# Patient Record
Sex: Female | Born: 1960 | Race: Black or African American | Hispanic: No | Marital: Married | Smoking: Never smoker
Health system: Southern US, Community
[De-identification: ages and names within clinical notes are randomized; demographics above are authoritative.]

## PROBLEM LIST (undated history)

## (undated) DIAGNOSIS — I10 Essential (primary) hypertension: Secondary | ICD-10-CM

## (undated) DIAGNOSIS — J45909 Unspecified asthma, uncomplicated: Secondary | ICD-10-CM

---

## 2010-02-10 ENCOUNTER — Ambulatory Visit: Payer: Self-pay | Admitting: Diagnostic Radiology

## 2010-02-10 ENCOUNTER — Emergency Department (HOSPITAL_BASED_OUTPATIENT_CLINIC_OR_DEPARTMENT_OTHER): Admission: EM | Admit: 2010-02-10 | Discharge: 2010-02-10 | Payer: Self-pay | Admitting: Emergency Medicine

## 2010-09-14 ENCOUNTER — Emergency Department (HOSPITAL_BASED_OUTPATIENT_CLINIC_OR_DEPARTMENT_OTHER)
Admission: EM | Admit: 2010-09-14 | Discharge: 2010-09-14 | Disposition: A | Discharge status: Home / Self Care | Payer: Self-pay | Attending: Emergency Medicine | Admitting: Emergency Medicine

## 2010-09-14 DIAGNOSIS — Y92009 Unspecified place in unspecified non-institutional (private) residence as the place of occurrence of the external cause: Secondary | ICD-10-CM | POA: Insufficient documentation

## 2010-09-14 DIAGNOSIS — X12XXXA Contact with other hot fluids, initial encounter: Secondary | ICD-10-CM | POA: Insufficient documentation

## 2010-09-14 DIAGNOSIS — T2121XA Burn of second degree of chest wall, initial encounter: Secondary | ICD-10-CM | POA: Insufficient documentation

## 2010-10-05 ENCOUNTER — Emergency Department (INDEPENDENT_AMBULATORY_CARE_PROVIDER_SITE_OTHER): Payer: Self-pay

## 2010-10-05 ENCOUNTER — Emergency Department (HOSPITAL_BASED_OUTPATIENT_CLINIC_OR_DEPARTMENT_OTHER)
Admission: EM | Admit: 2010-10-05 | Discharge: 2010-10-05 | Disposition: A | Discharge status: Home / Self Care | Payer: Self-pay | Attending: Emergency Medicine | Admitting: Emergency Medicine

## 2010-10-05 DIAGNOSIS — K219 Gastro-esophageal reflux disease without esophagitis: Secondary | ICD-10-CM | POA: Insufficient documentation

## 2010-10-05 DIAGNOSIS — R110 Nausea: Secondary | ICD-10-CM

## 2010-10-05 DIAGNOSIS — R0602 Shortness of breath: Secondary | ICD-10-CM | POA: Insufficient documentation

## 2010-10-05 DIAGNOSIS — K8020 Calculus of gallbladder without cholecystitis without obstruction: Secondary | ICD-10-CM | POA: Insufficient documentation

## 2010-10-05 DIAGNOSIS — R0789 Other chest pain: Secondary | ICD-10-CM

## 2010-10-05 DIAGNOSIS — R1013 Epigastric pain: Secondary | ICD-10-CM | POA: Insufficient documentation

## 2010-10-05 LAB — POCT CARDIAC MARKERS
CKMB, poc: 2.4 ng/mL (ref 1.0–8.0)
Myoglobin, poc: 80.8 ng/mL (ref 12–200)
Troponin i, poc: <0.05 ng/mL (ref 0.00–0.09)

## 2010-10-05 LAB — COMPREHENSIVE METABOLIC PANEL
AST: 47 U/L — ABNORMAL HIGH (ref 0–37)
Alkaline Phosphatase: 84 U/L (ref 39–117)
BUN: 14 mg/dL (ref 6–23)
CO2: 28 mEq/L (ref 19–32)
Creatinine, Ser: 0.7 mg/dL (ref 0.4–1.2)
Potassium: 3.4 mEq/L — ABNORMAL LOW (ref 3.5–5.1)
Sodium: 145 mEq/L (ref 135–145)
Total Protein: 8.2 g/dL (ref 6.0–8.3)

## 2010-10-05 LAB — CBC
HCT: 37.1 % (ref 36.0–46.0)
MCH: 29.4 pg (ref 26.0–34.0)
MCV: 88.8 fL (ref 78.0–100.0)
Platelets: 189 10*3/uL (ref 150–400)
RDW: 13.1 % (ref 11.5–15.5)
WBC: 5.9 10*3/uL (ref 4.0–10.5)

## 2010-10-05 LAB — D-DIMER, QUANTITATIVE: D-Dimer, Quant: <0.22 ug/mL-FEU (ref 0.00–0.48)

## 2010-10-06 ENCOUNTER — Ambulatory Visit (HOSPITAL_BASED_OUTPATIENT_CLINIC_OR_DEPARTMENT_OTHER)
Admission: RE | Admit: 2010-10-06 | Discharge: 2010-10-06 | Disposition: A | Discharge status: Home / Self Care | Payer: Self-pay | Source: Ambulatory Visit | Attending: Emergency Medicine | Admitting: Emergency Medicine

## 2010-10-06 ENCOUNTER — Other Ambulatory Visit (HOSPITAL_BASED_OUTPATIENT_CLINIC_OR_DEPARTMENT_OTHER): Payer: Self-pay

## 2010-10-06 DIAGNOSIS — R109 Unspecified abdominal pain: Secondary | ICD-10-CM | POA: Insufficient documentation

## 2010-10-06 DIAGNOSIS — R0602 Shortness of breath: Secondary | ICD-10-CM | POA: Insufficient documentation

## 2010-10-06 DIAGNOSIS — R1013 Epigastric pain: Secondary | ICD-10-CM

## 2011-07-23 ENCOUNTER — Emergency Department (INDEPENDENT_AMBULATORY_CARE_PROVIDER_SITE_OTHER): Payer: Managed Care, Other (non HMO)

## 2011-07-23 ENCOUNTER — Emergency Department (HOSPITAL_BASED_OUTPATIENT_CLINIC_OR_DEPARTMENT_OTHER)
Admission: EM | Admit: 2011-07-23 | Discharge: 2011-07-23 | Disposition: A | Discharge status: Home / Self Care | Payer: Managed Care, Other (non HMO) | Attending: Emergency Medicine | Admitting: Emergency Medicine

## 2011-07-23 ENCOUNTER — Encounter (HOSPITAL_BASED_OUTPATIENT_CLINIC_OR_DEPARTMENT_OTHER): Payer: Self-pay

## 2011-07-23 DIAGNOSIS — M549 Dorsalgia, unspecified: Secondary | ICD-10-CM | POA: Insufficient documentation

## 2011-07-23 DIAGNOSIS — R109 Unspecified abdominal pain: Secondary | ICD-10-CM

## 2011-07-23 DIAGNOSIS — K7689 Other specified diseases of liver: Secondary | ICD-10-CM | POA: Insufficient documentation

## 2011-07-23 DIAGNOSIS — R319 Hematuria, unspecified: Secondary | ICD-10-CM | POA: Insufficient documentation

## 2011-07-23 DIAGNOSIS — M545 Low back pain, unspecified: Secondary | ICD-10-CM

## 2011-07-23 DIAGNOSIS — M48061 Spinal stenosis, lumbar region without neurogenic claudication: Secondary | ICD-10-CM | POA: Insufficient documentation

## 2011-07-23 DIAGNOSIS — K429 Umbilical hernia without obstruction or gangrene: Secondary | ICD-10-CM

## 2011-07-23 MED ORDER — HYDROCODONE-ACETAMINOPHEN 5-500 MG PO TABS: 1.0000 | ORAL_TABLET | Freq: Four times a day (QID) | ORAL | Status: AC | PRN

## 2011-07-23 NOTE — ED Notes (Signed)
RLQ pain today, nausea started yesterday-denies v/d,denies urinary d/c

## 2011-07-23 NOTE — ED Provider Notes (Signed)
This chart was scribed for Linda Bucco, MD by [name]. The patient was seen in room MH11/MH11 at 5:47 PM.  CSN: [id number]  Arrival date & time 07/23/11  1707   First MD Initiated Contact with Patient 07/23/11 1744      Chief Complaint  Patient presents with  . Abdominal Pain    (Consider location/radiation/quality/duration/timing/severity/associated sxs/prior treatment) Patient is a 51 y.o. female presenting with abdominal pain. The history is provided by the patient.  Abdominal Pain The primary symptoms of the illness include abdominal pain. The primary symptoms of the illness do not include fever, nausea, vomiting or dysuria. The current episode started 3 to 5 hours ago. The onset of the illness was sudden. The problem has been gradually worsening.  The patient states that she believes she is currently not pregnant. Additional symptoms associated with the illness include hematuria and back pain. Symptoms associated with the illness do not include chills.   Pt c/o sudden onset moderate to severe abdominal pain with a pinching sensation in her right side. Pain does not radiate. Pt has history of back pain. Pt has some nausea but denies any runny nose cough, rash, congestion, cold, or fever. Pain started around 2:00pm today. Pain worsens on movement. Comes and goes.  No hx of pain like this in past.  Is located in RLQ History reviewed. No pertinent past medical history.  History reviewed. No pertinent past surgical history.  No family history on file.  History  Substance Use Topics  . Smoking status: Never Smoker   . Smokeless tobacco: Not on file  . Alcohol Use: No    OB History    Grav Para Term Preterm Abortions TAB SAB Ect Mult Living                  Review of Systems  Constitutional: Negative for fever and chills.  Gastrointestinal: Positive for abdominal pain. Negative for nausea and vomiting.  Genitourinary: Positive for hematuria. Negative for dysuria.    Musculoskeletal: Positive for back pain.   10 Systems reviewed and are negative for acute change except as noted in the HPI.  Allergies  Fish allergy  Home Medications   Current Outpatient Rx  Name Route Sig Dispense Refill  . VITAMIN B-12 1000 MCG PO TABS Oral Take 1,000 mcg by mouth daily.    Marland Kitchen HYDROCODONE-ACETAMINOPHEN 5-500 MG PO TABS Oral Take 1-2 tablets by mouth every 6 (six) hours as needed for pain. 15 tablet 0    BP 122/78  Pulse 85  Temp(Src) 98 F (36.7 C) (Oral)  Resp 20  Ht 5\' 5"  (1.651 m)  Wt 250 lb (113.399 kg)  BMI 41.60 kg/m2  SpO2 100%  Physical Exam  Nursing note and vitals reviewed. Constitutional: She is oriented to person, place, and time. She appears well-developed and well-nourished.  HENT:  Head: Normocephalic and atraumatic.  Eyes: Conjunctivae are normal. Pupils are equal, round, and reactive to light.  Neck: Normal range of motion. Neck supple.  Cardiovascular: Normal rate, regular rhythm and normal heart sounds.   Pulmonary/Chest: Effort normal and breath sounds normal.  Abdominal: Soft. There is tenderness in the right lower quadrant. There is CVA tenderness (right CVA tenderness).  Musculoskeletal: Normal range of motion.  Neurological: She is alert and oriented to person, place, and time.  Skin: Skin is warm and dry. No rash noted.  Psychiatric: She has a normal mood and affect. Her behavior is normal.    ED Course  Procedures (including  critical care time) DIAGNOSTIC STUDIES: Oxygen Saturation is 100% on room air, normal by my interpretation.    COORDINATION OF CARE:    Results for orders placed during the hospital encounter of 10/05/10  CBC      Component Value Range   WBC 5.9  4.0 - 10.5 (K/uL)   RBC 4.18  3.87 - 5.11 (MIL/uL)   Hemoglobin 12.3  12.0 - 15.0 (g/dL)   HCT 40.9  81.1 - 91.4 (%)   MCV 88.8  78.0 - 100.0 (fL)   MCH 29.4  26.0 - 34.0 (pg)   MCHC 33.2  30.0 - 36.0 (g/dL)   RDW 78.2  95.6 - 21.3 (%)    Platelets 189  150 - 400 (K/uL)  COMPREHENSIVE METABOLIC PANEL      Component Value Range   Sodium 145  135 - 145 (mEq/L)   Potassium 3.4 (*) 3.5 - 5.1 (mEq/L)   Chloride 105  96 - 112 (mEq/L)   CO2 28  19 - 32 (mEq/L)   Glucose, Bld 87  70 - 99 (mg/dL)   BUN 14  6 - 23 (mg/dL)   Creatinine, Ser .7  0.4 - 1.2 (mg/dL)   Calcium 9.2  8.4 - 08.6 (mg/dL)   Total Protein 8.2  6.0 - 8.3 (g/dL)   Albumin 4.4  3.5 - 5.2 (g/dL)   AST 47 (*) 0 - 37 (U/L)   ALT 36 (*) 0 - 35 (U/L)   Alkaline Phosphatase 84  39 - 117 (U/L)   Total Bilirubin 1.4 (*) 0.3 - 1.2 (mg/dL)   GFR calc non Af Amer >60  >60 (mL/min)   GFR calc Af Amer    >60 (mL/min)   Value: >60            The eGFR has been calculated     using the MDRD equation.     This calculation has not been     validated in all clinical     situations.     eGFR's persistently     <60 mL/min signify     possible Chronic Kidney Disease.  POCT CARDIAC MARKERS      Component Value Range   Myoglobin, poc 80.8  12 - 200 (ng/mL)   CKMB, poc 2.4  1.0 - 8.0 (ng/mL)   Troponin i, poc <0.05  0.00 - 0.09 (ng/mL)   Comment       Value:            TROPONIN VALUES IN THE RANGE     OF 0.00-0.09 ng/mL SHOW     NO INDICATION OF     MYOCARDIAL INJURY.                PERSISTENTLY INCREASED TROPONIN     VALUES IN THE RANGE OF 0.10-0.24     ng/mL CAN BE SEEN IN:           -UNSTABLE ANGINA           -CONGESTIVE HEART FAILURE           -MYOCARDITIS           -CHEST TRAUMA           -ARRYHTHMIAS           -LATE PRESENTING MI           -COPD       CLINICAL FOLLOW-UP RECOMMENDED.                TROPONIN VALUES >=0.25  ng/mL     INDICATE POSSIBLE MYOCARDIAL     ISCHEMIA. SERIAL TESTING     RECOMMENDED.  D-DIMER, QUANTITATIVE      Component Value Range   D-Dimer, Quant    0.00 - 0.48 (ug/mL-FEU)   Value: <0.22            AT THE INHOUSE ESTABLISHED CUTOFF     VALUE OF 0.48 ug/mL FEU,     THIS ASSAY HAS BEEN DOCUMENTED     IN THE LITERATURE TO HAVE      A SENSITIVITY AND NEGATIVE     PREDICTIVE VALUE OF AT LEAST     98 TO 99%.  THE TEST RESULT     SHOULD BE CORRELATED WITH     AN ASSESSMENT OF THE CLINICAL     PROBABILITY OF DVT / VTE.  LIPASE, BLOOD      Component Value Range   Lipase 58  23 - 300 (U/L)  POCT CARDIAC MARKERS      Component Value Range   Myoglobin, poc 62.8  12 - 200 (ng/mL)   CKMB, poc 1.7  1.0 - 8.0 (ng/mL)   Troponin i, poc <0.05  0.00 - 0.09 (ng/mL)   Comment       Value:            TROPONIN VALUES IN THE RANGE     OF 0.00-0.09 ng/mL SHOW     NO INDICATION OF     MYOCARDIAL INJURY.                PERSISTENTLY INCREASED TROPONIN     VALUES IN THE RANGE OF 0.10-0.24     ng/mL CAN BE SEEN IN:           -UNSTABLE ANGINA           -CONGESTIVE HEART FAILURE           -MYOCARDITIS           -CHEST TRAUMA           -ARRYHTHMIAS           -LATE PRESENTING MI           -COPD       CLINICAL FOLLOW-UP RECOMMENDED.                TROPONIN VALUES >=0.25 ng/mL     INDICATE POSSIBLE MYOCARDIAL     ISCHEMIA. SERIAL TESTING     RECOMMENDED.   Ct Abdomen Pelvis Wo Contrast  07/23/2011  *RADIOLOGY REPORT*  Clinical Data: Right flank and low back pain, hematuria  CT ABDOMEN AND PELVIS WITHOUT CONTRAST  Technique:  Multidetector CT imaging of the abdomen and pelvis was performed following the standard protocol without intravenous contrast. Sagittal and coronal MPR images reconstructed from axial data set.  Comparison: None  Findings: Dependent atelectasis at lung bases. Mild fatty infiltration of liver. No urinary tract calcification, hydronephrosis, or ureteral dilatation. Within limits of a nonenhanced exam, no additional focal abnormalities of the liver, spleen, pancreas, kidneys, or adrenal glands. Normal appendix. Tiny umbilical hernia containing fat. Uterus is slightly prominent in size with nodular contours suggesting leiomyomata. Bladder and adnexae unremarkable.  Stomach and bowel loops grossly normal for  technique. No mass, adenopathy, free fluid, or inflammatory process. No hernia acute bony lesions. Significant spinal stenosis identified at L4-L5 secondary to facet hypertrophy, mild lateral flavum thickening, endplate spurring, and a central disc herniation. Additional degrees of AP spinal narrowing are seen at areas of posterior  longitudinal ligament calcification at L1-L2 and L2-L3.  IMPRESSION: Mild fatty infiltration of liver. Tiny umbilical hernia. Question uterine leiomyomata. Multifactorial spinal stenosis L4-L5 as above.  Original Report Authenticated By: Lollie Marrow, M.D.       1. Abdominal pain   2. Hematuria       MDM  Pt was seen prior to coming her at a doctor's office.  I reviewed labs that she had done there.  Had CBC which showed normal WBC, normal Hgb/platelets.  Urinalysis which showed +blood, no signs of infection.  AAS showed stool, otherwise unremarkable  19:04, pt's pain better.  Appendix normal, no kidney stones. No evidence of UTI.  No obstruction.  Pt's pain better without meds.  Will f/u in am with her PMD for recheck  I personally performed the services described in this documentation, which was scribed in my presence.  The recorded information has been reviewed and considered.       Linda Bucco, MD 07/23/11 [phone]

## 2012-08-10 IMAGING — CR DG LUMBAR SPINE COMPLETE 4+V
5 series · 5 of 5 positions shown · non-contrast
Comparison: None.

CLINICAL DATA: Fall.  Low back injury and pain.

LUMBAR SPINE - COMPLETE 4+ VIEW

[t l-spine a.p.]
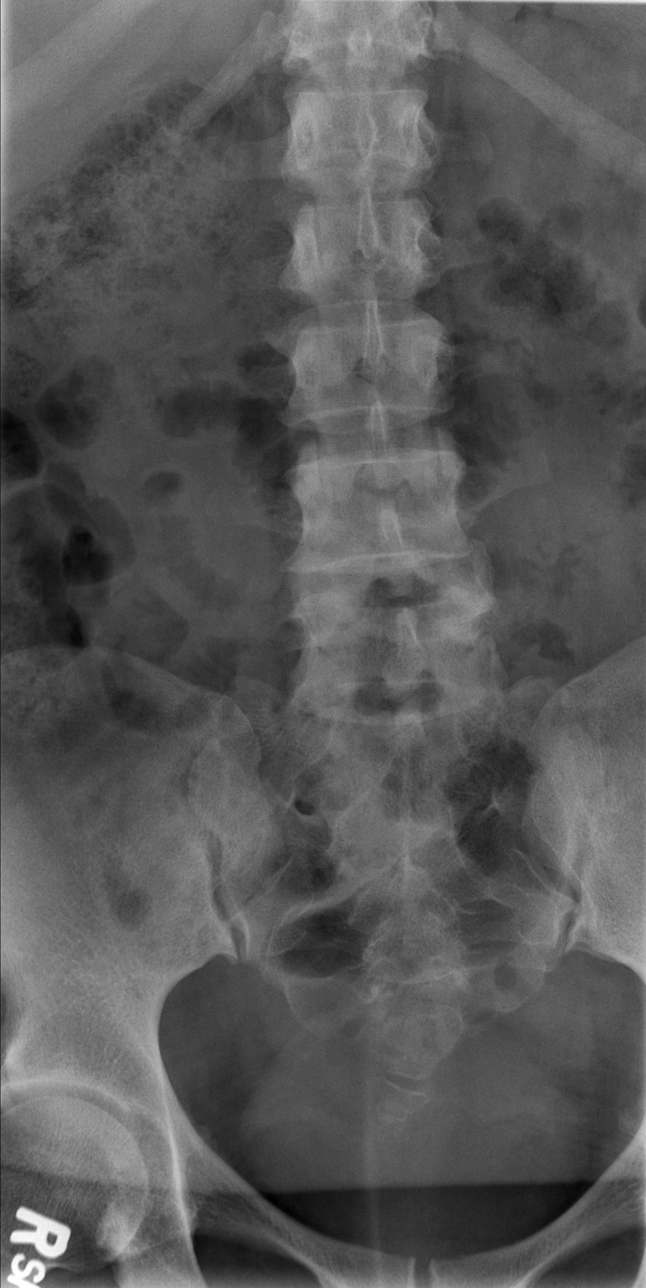

[t l-spine oblique exposure (1 of 2)]
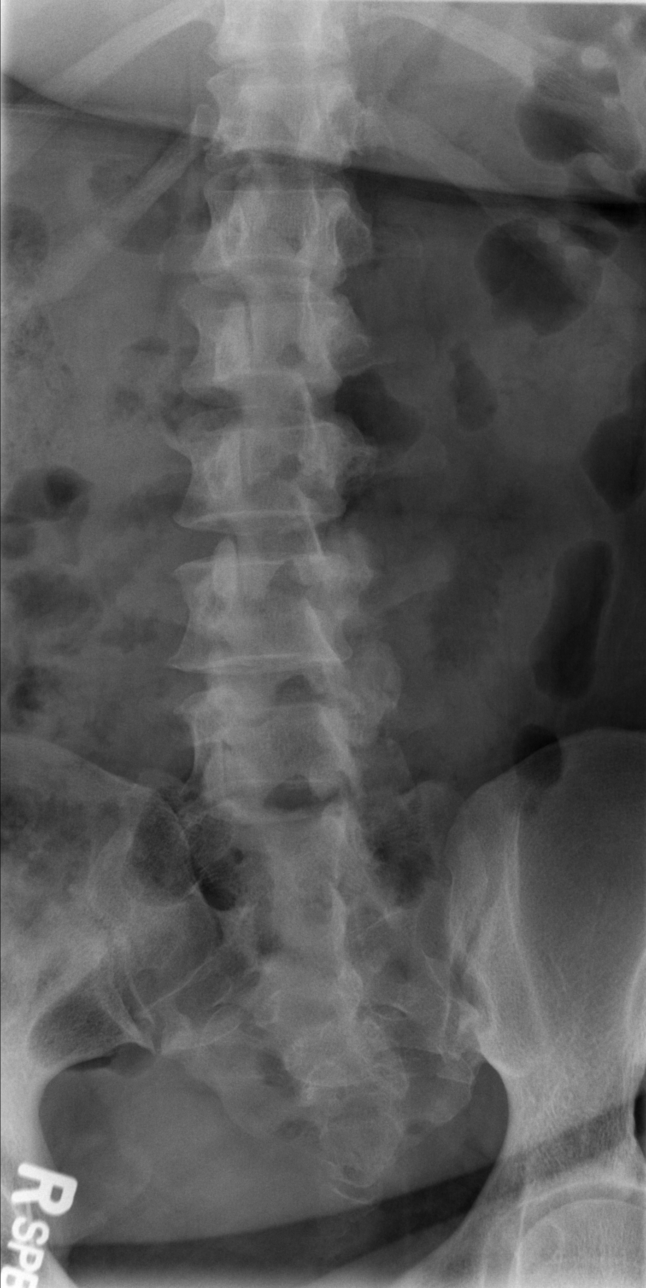

[t l-spine oblique exposure (2 of 2)]
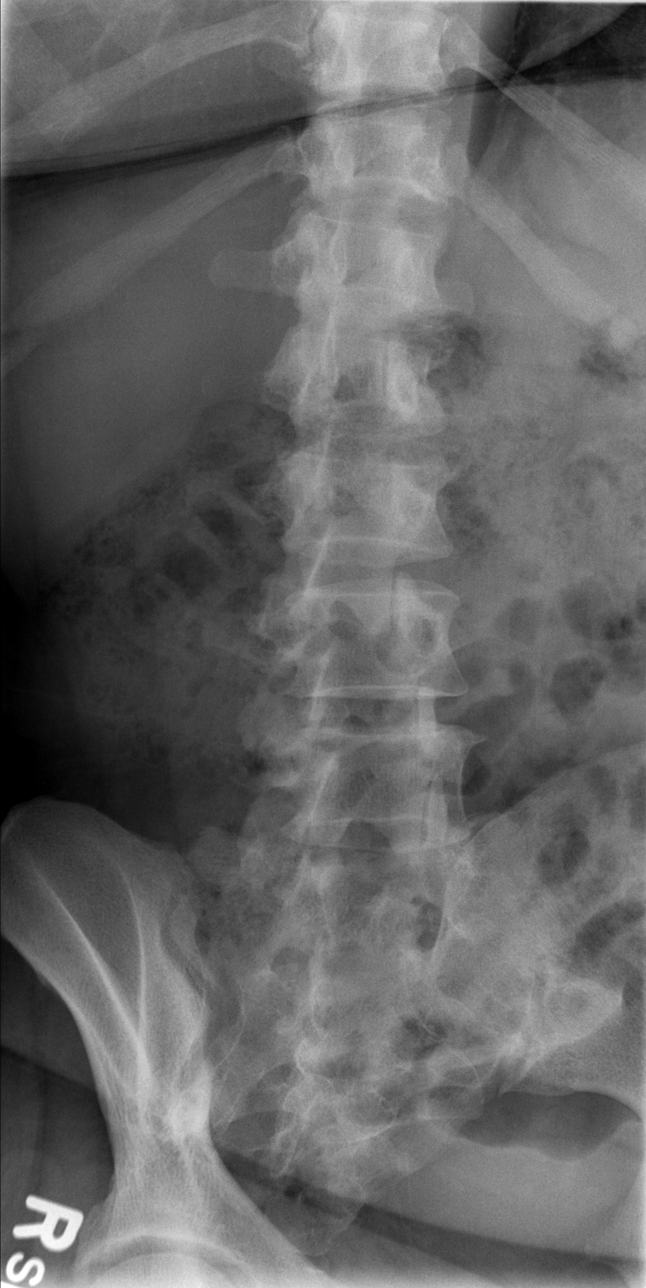

[t l-spine lat]
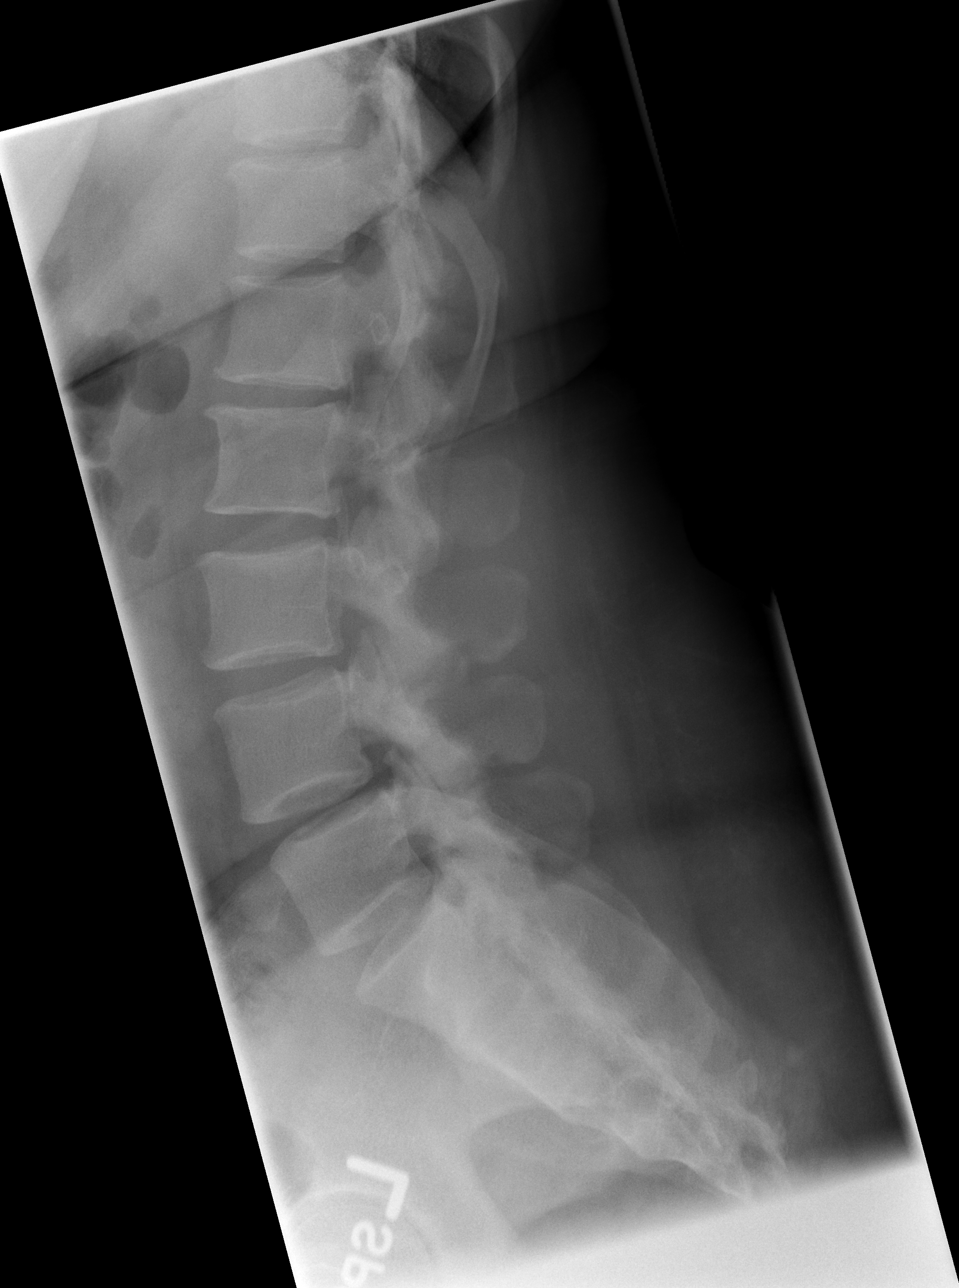

[t l-spine l5-s1 spot]
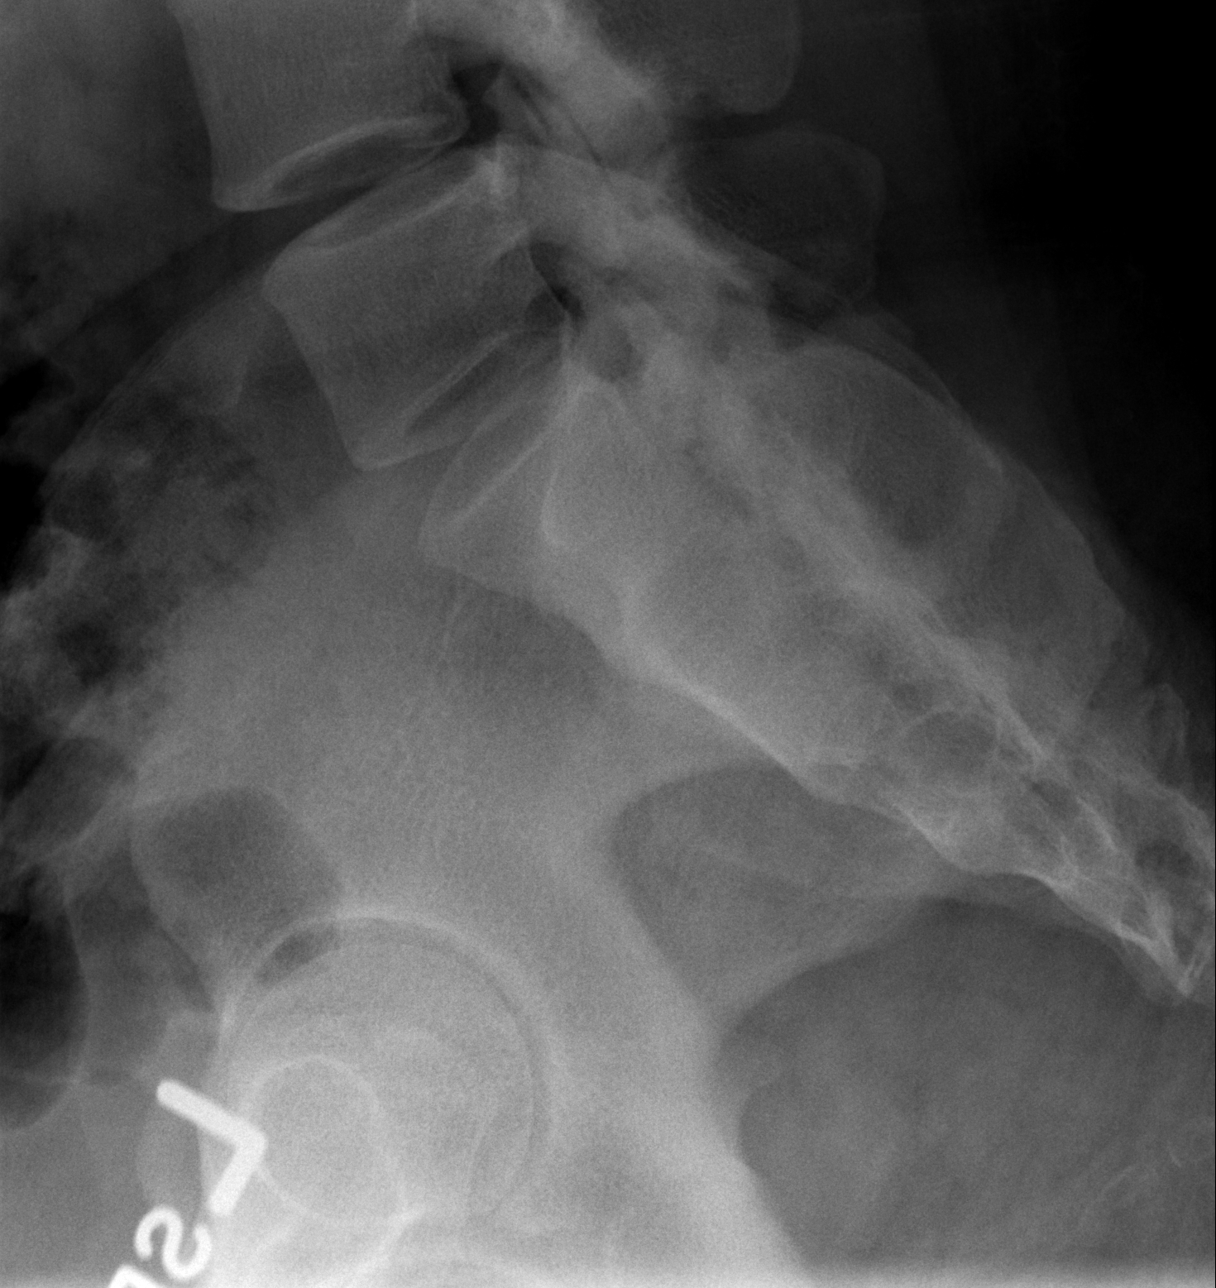

[5 of 5 positions shown; findings below may reference images not displayed]

FINDINGS: No evidence of lumbar spine fracture, spondylolysis, or
spondylolisthesis.

Mild degenerative disc space narrowing is seen at L4-5.  Mild facet
DJD is also seen bilaterally at L4-5.  No other significant bone
abnormality identified.
IMPRESSION: 1.  No acute findings.
2.  Mild L4-5 degenerative disc disease and facet DJD.

## 2013-03-08 ENCOUNTER — Encounter (HOSPITAL_BASED_OUTPATIENT_CLINIC_OR_DEPARTMENT_OTHER): Payer: Self-pay

## 2013-03-08 ENCOUNTER — Emergency Department (HOSPITAL_BASED_OUTPATIENT_CLINIC_OR_DEPARTMENT_OTHER)
Admission: EM | Admit: 2013-03-08 | Discharge: 2013-03-08 | Disposition: A | Discharge status: Home / Self Care | Payer: Managed Care, Other (non HMO) | Attending: Emergency Medicine | Admitting: Emergency Medicine

## 2013-03-08 ENCOUNTER — Emergency Department (HOSPITAL_BASED_OUTPATIENT_CLINIC_OR_DEPARTMENT_OTHER): Payer: Managed Care, Other (non HMO)

## 2013-03-08 DIAGNOSIS — Z79899 Other long term (current) drug therapy: Secondary | ICD-10-CM | POA: Insufficient documentation

## 2013-03-08 DIAGNOSIS — J45901 Unspecified asthma with (acute) exacerbation: Secondary | ICD-10-CM | POA: Insufficient documentation

## 2013-03-08 DIAGNOSIS — R072 Precordial pain: Secondary | ICD-10-CM | POA: Insufficient documentation

## 2013-03-08 DIAGNOSIS — I10 Essential (primary) hypertension: Secondary | ICD-10-CM | POA: Insufficient documentation

## 2013-03-08 DIAGNOSIS — R079 Chest pain, unspecified: Secondary | ICD-10-CM

## 2013-03-08 DIAGNOSIS — J069 Acute upper respiratory infection, unspecified: Secondary | ICD-10-CM | POA: Insufficient documentation

## 2013-03-08 DIAGNOSIS — R110 Nausea: Secondary | ICD-10-CM | POA: Insufficient documentation

## 2013-03-08 HISTORY — DX: Unspecified asthma, uncomplicated: J45.909

## 2013-03-08 HISTORY — DX: Essential (primary) hypertension: I10

## 2013-03-08 LAB — BASIC METABOLIC PANEL
CO2: 27 mEq/L (ref 19–32)
Creatinine, Ser: 1.1 mg/dL (ref 0.50–1.10)
GFR calc Af Amer: 65 mL/min — ABNORMAL LOW (ref >90)
GFR calc non Af Amer: 56 mL/min — ABNORMAL LOW (ref >90)
Glucose, Bld: 105 mg/dL — ABNORMAL HIGH (ref 70–99)
Potassium: 3.4 mEq/L — ABNORMAL LOW (ref 3.5–5.1)

## 2013-03-08 LAB — CBC
HCT: 37.1 % (ref 36.0–46.0)
MCHC: 33.4 g/dL (ref 30.0–36.0)
MCV: 91.4 fL (ref 78.0–100.0)

## 2013-03-08 LAB — TROPONIN I
Troponin I: <0.3 ng/mL (ref <0.30)
Troponin I: <0.3 ng/mL (ref <0.30)

## 2013-03-08 MED ORDER — ALBUTEROL SULFATE HFA 108 (90 BASE) MCG/ACT IN AERS: 1.0000 | INHALATION_SPRAY | Freq: Four times a day (QID) | RESPIRATORY_TRACT | Status: DC | PRN

## 2013-03-08 MED ORDER — NITROGLYCERIN 0.4 MG SL SUBL
0.4000 mg | SUBLINGUAL_TABLET | SUBLINGUAL | Status: DC | PRN
  Filled 2013-03-08: qty 25

## 2013-03-08 MED ORDER — ALBUTEROL SULFATE (5 MG/ML) 0.5% IN NEBU: 2.5000 mg | INHALATION_SOLUTION | RESPIRATORY_TRACT | Status: DC | PRN

## 2013-03-08 MED ORDER — SODIUM CHLORIDE 0.9 % IV BOLUS (SEPSIS)
1000.0000 mL | Freq: Once | INTRAVENOUS | Status: AC
  Administered 2013-03-08: 1000 mL via INTRAVENOUS

## 2013-03-08 MED ORDER — ALBUTEROL SULFATE (5 MG/ML) 0.5% IN NEBU
2.5000 mg | INHALATION_SOLUTION | RESPIRATORY_TRACT | Status: DC
  Administered 2013-03-08: 2.5 mg via RESPIRATORY_TRACT
  Filled 2013-03-08: qty 0.5

## 2013-03-08 MED ORDER — IPRATROPIUM BROMIDE 0.02 % IN SOLN
0.5000 mg | RESPIRATORY_TRACT | Status: DC
  Administered 2013-03-08: 0.5 mg via RESPIRATORY_TRACT
  Filled 2013-03-08: qty 2.5

## 2013-03-08 MED ORDER — IOHEXOL 350 MG/ML SOLN
100.0000 mL | Freq: Once | INTRAVENOUS | Status: AC | PRN
Start: 2013-03-08 — End: 2013-03-08
  Administered 2013-03-08: 100 mL via INTRAVENOUS

## 2013-03-08 MED ORDER — PREDNISONE 50 MG PO TABS
60.0000 mg | ORAL_TABLET | Freq: Once | ORAL | Status: AC
  Administered 2013-03-08: 60 mg via ORAL
  Filled 2013-03-08: qty 1

## 2013-03-08 MED ORDER — IPRATROPIUM BROMIDE 0.02 % IN SOLN: 0.5000 mg | RESPIRATORY_TRACT | Status: DC | PRN

## 2013-03-08 MED ORDER — ASPIRIN 325 MG PO TABS
325.0000 mg | ORAL_TABLET | Freq: Once | ORAL | Status: AC
  Administered 2013-03-08: 325 mg via ORAL
  Filled 2013-03-08: qty 1

## 2013-03-08 NOTE — ED Provider Notes (Signed)
CSN: [id number]     Arrival date & time 03/08/13  1440 History  This chart was scribed for Linda Hait, MD by Joaquin Music, ED Scribe. This patient was seen in room MH12/MH12 and the patient's care was started at 3:30 PM    Chief Complaint  Patient presents with  . URI    Patient is a 52 y.o. female presenting with chest pain. The history is provided by the patient. No language interpreter was used.  Chest Pain Pain location:  Substernal area and L chest Pain radiates to:  L shoulder Associated symptoms: nausea and shortness of breath   Associated symptoms: no abdominal pain, no cough and no fever    HPI Comments: Linda Mckay is a 52 y.o. female who presents to the Emergency Department complaining of intermittent chest pain that began yesterday. She describes this pain as heaviness and has radiation to the left shoulder. She states the episodes last about 10-15 minutes and she is currently experiencing the chest pain. She has associated SOB and nausea with this chest pain. She states the chest pain is worse with physical exertion. Nothing eases the pain. She had rhinorrhea and nasal congestion from an URI about 2-3 days ago. She may have sick contacts at work related to the URI. She denies LOC, cough, abdominal pain, fever.    Past Medical History  Diagnosis Date  . Hypertension   . Asthma     in childhood   History reviewed. No pertinent past surgical history. No family history on file. History  Substance Use Topics  . Smoking status: Never Smoker   . Smokeless tobacco: Not on file  . Alcohol Use: No   OB History   Grav Para Term Preterm Abortions TAB SAB Ect Mult Living                 Review of Systems  Constitutional: Negative for fever.  HENT: Positive for congestion (2 days ago) and rhinorrhea ( 2 days ago).   Respiratory: Positive for shortness of breath. Negative for cough.   Cardiovascular: Positive for chest pain.  Gastrointestinal:  Positive for nausea. Negative for abdominal pain.  All other systems reviewed and are negative.    Allergies  Fish allergy  Home Medications   Current Outpatient Rx  Name  Route  Sig  Dispense  Refill  . vitamin B-12 (CYANOCOBALAMIN) 1000 MCG tablet   Oral   Take 1,000 mcg by mouth daily.          BP 143/92  Pulse 104  Resp 22  Ht 5\' 6"  (1.676 m)  SpO2 100%  Physical Exam  Nursing note and vitals reviewed. Constitutional: She is oriented to person, place, and time. She appears well-developed and well-nourished. No distress.  HENT:  Head: Normocephalic and atraumatic.  Eyes: Conjunctivae and EOM are normal. Pupils are equal, round, and reactive to light.  Neck: Normal range of motion. Neck supple. No tracheal deviation present.  Cardiovascular: Normal rate, regular rhythm and normal heart sounds.   Pulmonary/Chest: Effort normal and breath sounds normal. No respiratory distress. She has no wheezes.  Abdominal: Soft. Bowel sounds are normal. She exhibits no distension. There is no tenderness.  Musculoskeletal: Normal range of motion.  Neurological: She is alert and oriented to person, place, and time.  Skin: Skin is warm and dry.  Psychiatric: She has a normal mood and affect. Her behavior is normal.    ED Course  Procedures  DIAGNOSTIC STUDIES: Oxygen Saturation is 100%  on RA, normal by my interpretation.    COORDINATION OF CARE: 3:38 PM-Discussed treatment plan pt at bedside and pt agreed to plan.   Labs Review Labs Reviewed  BASIC METABOLIC PANEL - Abnormal; Notable for the following:    Potassium 3.4 (*)    Glucose, Bld 105 (*)    GFR calc non Af Amer 56 (*)    GFR calc Af Amer 65 (*)    All other components within normal limits  D-DIMER, QUANTITATIVE - Abnormal; Notable for the following:    D-Dimer, Quant 9.48 (*)    All other components within normal limits  CBC  TROPONIN I  TROPONIN I   Imaging Review Dg Chest 2 View  03/08/2013   CLINICAL DATA:   Shortness of Breath, runny nose, congestion  EXAM: CHEST  2 VIEW  COMPARISON:  None.  FINDINGS: The heart size and mediastinal contours are within normal limits. Both lungs are clear. The visualized skeletal structures are unremarkable.  IMPRESSION: No active cardiopulmonary disease.   Electronically Signed   By: Natasha Mead   On: 03/08/2013 15:37   Ct Angio Chest Pe W/cm &/or Wo Cm  03/08/2013   CLINICAL DATA:  Chest pain and shortness of breath.  EXAM: CT ANGIOGRAPHY CHEST WITH CONTRAST  TECHNIQUE: Multidetector CT imaging of the chest was performed using the standard protocol during bolus administration of intravenous contrast. Multiplanar CT image reconstructions including MIPs were obtained to evaluate the vascular anatomy.  CONTRAST:  OMNIPAQUE IOHEXOL 350 MG/ML SOLN  COMPARISON:  None.  FINDINGS: The study was repeated because on the 1st examination the pulmonary arteries are not well opacified.  The chest wall is unremarkable.  The bony thorax is intact.  The heart is normal in size no pericardial effusion. No mediastinal or hilar mass or adenopathy. The esophagus is grossly normal. The aorta is normal in caliber. No dissection.  The pulmonary arterial tree is well opacified. No filling defects to suggest pulmonary emboli.  The lungs are clear  The upper abdomen demonstrates diffuse fatty infiltration of the liver.  Review of the MIP images confirms the above findings.  IMPRESSION: No CT findings for pulmonary embolism.  No acute pulmonary findings or worrisome pulmonary lesions.  Normal thoracic aorta.  Diffuse fatty infiltration of the liver   Electronically Signed   By: Loralie Champagne M.D.   On: 03/08/2013 19:33   also shake her   Date: 03/08/2013  Rate: 93  Rhythm: normal sinus rhythm  QRS Axis: normal  Intervals: normal  ST/T Wave abnormalities: normal  Conduction Disutrbances:none  Narrative Interpretation:   Old EKG Reviewed: unchanged   MDM   1. Chest pain    52 year old  female presents with chest pain and shortness of breath. She states she's had some upper respiratory symptoms including nasal congestion runny nose for the past 2 days. Did suggest today, she is noted heaviness in her chest and difficulty breathing with exertion. Her heaviness in her chest rates her shoulder and neck. She has history of asthma and hypertension. She denies any cardiac history. She states she's had several sick contacts including her children and people at work. Her lungs have no overt wheezing but I notice. She has no rhinorrhea or sinus tenderness on exam. Her vitals are stable. She still having a little bit of mild chest heaviness. OB of her breathing treatments and she has a history of asthma and also check troponin to make sure her heart is okay. Her initial EKG  is normal without ischemia. Chest x-ray is normal. 79 for possible clot. D-dimer is elevated, we'll CT her chest. Her initial troponin is normal. She does say she is feeling much better after the breathing treatment and does not want another. CT PE study normal. Delta troponin is normal. Patient stable for discharge. This is likely mild wheezing with her history of asthma versus anxiety. There is some difficulty with the language barriers English is not her first language and I did not find it easy to get historical factors of her chest pain initially. Stable for discharge, given albuterol inhaler for other episodes of shortness of breath.  I have reviewed all labs and imaging and considered them in my medical decision making.   I personally performed the services described in this documentation, which was scribed in my presence. The recorded information has been reviewed and is accurate.     Linda Hait, MD 03/08/13 [phone]

## 2013-03-08 NOTE — ED Notes (Signed)
Pt hyperventilating initially while being triaged, O2 sats 100%.  Coaxed pt to breathe slowly and pt slowed respirations, only minimal wheezing noted per rt.  Has had cold symptoms for several days, runny nose and congestion.  Reports family currently with uri, slight nausea.

## 2013-05-25 NOTE — ED Notes (Signed)
Pt mother here requesting a reprint of school note.

## 2015-10-16 ENCOUNTER — Emergency Department (HOSPITAL_BASED_OUTPATIENT_CLINIC_OR_DEPARTMENT_OTHER)
Admission: EM | Admit: 2015-10-16 | Discharge: 2015-10-16 | Disposition: A | Discharge status: Home / Self Care | Payer: BLUE CROSS/BLUE SHIELD | Attending: Emergency Medicine | Admitting: Emergency Medicine

## 2015-10-16 ENCOUNTER — Encounter (HOSPITAL_BASED_OUTPATIENT_CLINIC_OR_DEPARTMENT_OTHER): Payer: Self-pay | Admitting: *Deleted

## 2015-10-16 DIAGNOSIS — I10 Essential (primary) hypertension: Secondary | ICD-10-CM | POA: Insufficient documentation

## 2015-10-16 DIAGNOSIS — L509 Urticaria, unspecified: Secondary | ICD-10-CM | POA: Diagnosis present

## 2015-10-16 DIAGNOSIS — J45909 Unspecified asthma, uncomplicated: Secondary | ICD-10-CM | POA: Diagnosis not present

## 2015-10-16 DIAGNOSIS — Z79899 Other long term (current) drug therapy: Secondary | ICD-10-CM | POA: Insufficient documentation

## 2015-10-16 MED ORDER — PREDNISONE 20 MG PO TABS
40.0000 mg | ORAL_TABLET | Freq: Every day | ORAL | Status: DC
  Administered 2015-10-16: 40 mg via ORAL
  Filled 2015-10-16: qty 2

## 2015-10-16 MED ORDER — LORATADINE 10 MG PO TABS
10.0000 mg | ORAL_TABLET | Freq: Once | ORAL | Status: AC
  Administered 2015-10-16: 10 mg via ORAL
  Filled 2015-10-16: qty 1

## 2015-10-16 MED ORDER — LORATADINE 10 MG PO TABS: 10.0000 mg | ORAL_TABLET | Freq: Every day | ORAL | Status: AC

## 2015-10-16 MED ORDER — PREDNISONE 10 MG PO TABS: 20.0000 mg | ORAL_TABLET | Freq: Every day | ORAL | Status: AC

## 2015-10-16 NOTE — ED Notes (Signed)
Hives that started yesterday.  [facility]

## 2015-10-16 NOTE — ED Provider Notes (Signed)
CSN: [id number]     Arrival date & time 10/16/15  2020 History   First MD Initiated Contact with Patient 10/16/15 2041     Chief Complaint  Patient presents with  . Allergic Reaction   HPI   55 year old female presents today with hives. Patient reports symptoms started yesterday with diffuse hives to her chest abdomen back and upper extremities. Patient reports a history of the same related to certain food intake. She reports she has not had any abnormal food or drink. Patient reports that approximately one week ago she started using a different soap, and approximately 4 weeks ago she started taking rifampin. Patient had rash to her skin, primary care did a TB skin test which read as positive, she had a negative x-ray. The option to take rifampin was given to the patient and she requested it. Patient reports she's never had problems up until today with taking the rifampin. She denies any swelling of the mouth throat, wheezing or any other signs of anaphylactic type reaction. Patient reports she was seen in urgent care yesterday was given a "cream" but does not know what was in it. She reports symptoms have not improved. She has not tried any medications prior to arrival.  Past Medical History  Diagnosis Date  . Hypertension   . Asthma     in childhood   History reviewed. No pertinent past surgical history. History reviewed. No pertinent family history. Social History  Substance Use Topics  . Smoking status: Never Smoker   . Smokeless tobacco: None  . Alcohol Use: No   OB History    No data available     Review of Systems  All other systems reviewed and are negative.   Allergies  Fish allergy  Home Medications   Prior to Admission medications   Medication Sig Start Date End Date Taking? Authorizing Provider  rifampin (RIFADIN) 300 MG capsule Take by mouth.   Yes Historical Provider, MD  loratadine (CLARITIN) 10 MG tablet Take 1 tablet (10 mg total) by mouth daily. 10/16/15    Eyvonne MechanicJeffrey Ambra Haverstick, PA-C  predniSONE (DELTASONE) 10 MG tablet Take 2 tablets (20 mg total) by mouth daily. Please take 4 tabs for 2 days, 3 tabs for 1 day, 2 tabs for 1 day, 1 tab for 1 day 10/16/15   Eyvonne MechanicJeffrey Grayling Schranz, PA-C  vitamin B-12 (CYANOCOBALAMIN) 1000 MCG tablet Take 1,000 mcg by mouth daily.    Historical Provider, MD   BP 125/92 mmHg  Pulse 117  Temp(Src) 98 F (36.7 C) (Oral)  Resp 18  Ht 5\' 6"  (1.676 m)  Wt 104.327 kg  BMI 37.14 kg/m2  SpO2 100%   Physical Exam  Constitutional: She is oriented to person, place, and time. She appears well-developed and well-nourished.  HENT:  Head: Normocephalic and atraumatic.  Mouth/Throat: Uvula is midline, oropharynx is clear and moist and mucous membranes are normal. No oropharyngeal exudate, posterior oropharyngeal edema, posterior oropharyngeal erythema or tonsillar abscesses.  Eyes: Conjunctivae are normal. Pupils are equal, round, and reactive to light. Right eye exhibits no discharge. Left eye exhibits no discharge. No scleral icterus.  Neck: Normal range of motion. No JVD present. No tracheal deviation present.  Pulmonary/Chest: Effort normal and breath sounds normal. No stridor. No respiratory distress. She has no wheezes. She has no rales. She exhibits no tenderness.  Neurological: She is alert and oriented to person, place, and time. Coordination normal.  Skin:  Hives noted to chest abdomen back and upper extremities  Psychiatric:  She has a normal mood and affect. Her behavior is normal. Judgment and thought content normal.  Nursing note and vitals reviewed.   ED Course  Procedures (including critical care time) Labs Review Labs Reviewed - No data to display  Imaging Review No results found. I have personally reviewed and evaluated these images and lab results as part of my medical decision-making.   EKG Interpretation None      MDM   Final diagnoses:  Hives    Labs:  Imaging:  Consults:  Therapeutics:  Prednisone,   Discharge Meds:   Assessment/Plan:55 year old female presents today with hives. Uncertain etiology of her hives, potential new soap change, potential medication. Patient has no signs of anaphylactic reaction, has not tried any significant therapies. She will be instructed to use Claritin, steroids, follow-up with primary care if symptoms persist, return to the ED if they worsen. She verbalized understanding and agreement today's plan had no further questions or concerns at time of discharge        Eyvonne Mechanic, PA-C 10/16/15 2111  Nelva Nay, MD 10/16/15 2125

## 2015-10-16 NOTE — Discharge Instructions (Signed)
Hives Hives are itchy, red, swollen areas of the skin. They can vary in size and location on your body. Hives can come and go for hours or several days (acute hives) or for several weeks (chronic hives). Hives do not spread from person to person (noncontagious). They may get worse with scratching, exercise, and emotional stress. CAUSES   Allergic reaction to food, additives, or drugs.  Infections, including the common cold.  Illness, such as vasculitis, lupus, or thyroid disease.  Exposure to sunlight, heat, or cold.  Exercise.  Stress.  Contact with chemicals. SYMPTOMS   Red or white swollen patches on the skin. The patches may change size, shape, and location quickly and repeatedly.  Itching.  Swelling of the hands, feet, and face. This may occur if hives develop deeper in the skin. DIAGNOSIS  Your caregiver can usually tell what is wrong by performing a physical exam. Skin or blood tests may also be done to determine the cause of your hives. In some cases, the cause cannot be determined. TREATMENT  Mild cases usually get better with medicines such as antihistamines. Severe cases may require an emergency epinephrine injection. If the cause of your hives is known, treatment includes avoiding that trigger.  HOME CARE INSTRUCTIONS   Avoid causes that trigger your hives.  Take antihistamines as directed by your caregiver to reduce the severity of your hives. Non-sedating or low-sedating antihistamines are usually recommended. Do not drive while taking an antihistamine.  Take any other medicines prescribed for itching as directed by your caregiver.  Wear loose-fitting clothing.  Keep all follow-up appointments as directed by your caregiver. SEEK MEDICAL CARE IF:   You have persistent or severe itching that is not relieved with medicine.  You have painful or swollen joints. SEEK IMMEDIATE MEDICAL CARE IF:   You have a fever.  Your tongue or lips are swollen.  You have  trouble breathing or swallowing.  You feel tightness in the throat or chest.  You have abdominal pain. These problems may be the first sign of a life-threatening allergic reaction. Call your local emergency services (911 in [place] MAKE SURE YOU:   Understand these instructions.  Will watch your condition.  Will get help right away if you are not doing well or get worse.   This information is not intended to replace advice given to you by your health care provider. Make sure you discuss any questions you have with your health care provider.   Document Released: 06/11/2005 Document Revised: 06/16/2013 Document Reviewed: 09/04/2011 Elsevier Interactive Patient Education 2016 [employer]  

## 2015-10-16 NOTE — ED Notes (Signed)
[facility]. Declined steroids yesterday d/t "don't like the way it makes me feel". Denies being given benadryl/ pepcid (or the like) or other [facility]. Was given an Rx for a cream. C/o rash, redness and itching. Redness urticaria and scratch marks noted. (denies: sob, oral swelling, dysphagia or dysphonia), no dyspnea noted. Throat unremarkable, LS CTA. Alert, NAD, restless, family present.

## 2017-08-10 ENCOUNTER — Emergency Department (HOSPITAL_BASED_OUTPATIENT_CLINIC_OR_DEPARTMENT_OTHER)
Admission: EM | Admit: 2017-08-10 | Discharge: 2017-08-10 | Disposition: A | Discharge status: Home / Self Care | Payer: BLUE CROSS/BLUE SHIELD | Attending: Emergency Medicine | Admitting: Emergency Medicine

## 2017-08-10 ENCOUNTER — Other Ambulatory Visit: Payer: Self-pay

## 2017-08-10 DIAGNOSIS — Z79899 Other long term (current) drug therapy: Secondary | ICD-10-CM | POA: Diagnosis not present

## 2017-08-10 DIAGNOSIS — M5442 Lumbago with sciatica, left side: Secondary | ICD-10-CM | POA: Diagnosis not present

## 2017-08-10 DIAGNOSIS — J45909 Unspecified asthma, uncomplicated: Secondary | ICD-10-CM | POA: Diagnosis not present

## 2017-08-10 DIAGNOSIS — I10 Essential (primary) hypertension: Secondary | ICD-10-CM | POA: Diagnosis not present

## 2017-08-10 DIAGNOSIS — M545 Low back pain: Secondary | ICD-10-CM | POA: Diagnosis present

## 2017-08-10 MED ORDER — METHOCARBAMOL 500 MG PO TABS: 500.0000 mg | ORAL_TABLET | Freq: Every evening | ORAL | 0 refills | Status: AC | PRN

## 2017-08-10 MED ORDER — MENTHOL-CAMPHOR 16-11 % EX CREA: 1.0000 "application " | TOPICAL_CREAM | Freq: Two times a day (BID) | CUTANEOUS | 0 refills | Status: AC | PRN

## 2017-08-10 NOTE — Discharge Instructions (Signed)
As discussed, follow the instructions provided to help with sciatic nerve pain. Apply ice to the area before exercises and heat to help with muscle spasm. Take muscle relaxant at night time or every 8 hours as needed. Do not drive or operate machinery while on this medication. Apply muscle cream as needed. Follow up with your primary care provider if symptoms persist beyond a week.  Return if symptoms worsen, loss of bowel or bladder function, numbness, fever, chills or other new concerning symptoms in the meantime.

## 2017-08-10 NOTE — ED Provider Notes (Signed)
[facility] EMERGENCY DEPARTMENT Provider Note   CSN: [id number] Arrival date & time: 08/10/17  1409     History   Chief Complaint Chief Complaint  Patient presents with  . Back Pain    HPI Linda Mckay is a 57 y.o. female with no past medical history presenting with progressive onset sharp left lower back pain for the last 3 days.  Seen at urgent care and given 2 injections of her buttocks and prescribed meloxicam.  She reports that she continues to experience this pain on the left side which radiates into her buttocks and the back of her left leg.  Denies any fall or injury.  She does lift heavy items at work. she has tried heat in the shower which helps alleviate the symptoms.  She has stopped taking her Aleve after she was prescribed the meloxicam.  Denies any urinary symptoms, no dysuria, hematuria.  Stating that the urine analysis at urgent care 2 days ago was normal. No weakness or numbness in the lower extremities. She denies any fever, chills, history of IV drug use, night sweats, Weight loss, loss of bowel bladder function, or history of IV drug use.  HPI  Past Medical History:  Diagnosis Date  . Asthma    in childhood  . Hypertension     There are no active problems to display for this patient.   No past surgical history on file.  OB History    No data available       Home Medications    Prior to Admission medications   Medication Sig Start Date End Date Taking? Authorizing Provider  gabapentin (NEURONTIN) 300 MG capsule Take 300 mg by mouth 3 (three) times daily.   Yes [provider]  meloxicam (MOBIC) 15 MG tablet Take 15 mg by mouth daily.   Yes [provider]  loratadine (CLARITIN) 10 MG tablet Take 1 tablet (10 mg total) by mouth daily. 10/16/15   Hedges, Tinnie Gens, PA-C  Menthol-Camphor (ICY HOT ADVANCED RELIEF) 16-11 % CREA Apply 1 application topically 2 (two) times daily as needed. 08/10/17   Georgiana Shore, PA-C    methocarbamol (ROBAXIN) 500 MG tablet Take 1 tablet (500 mg total) by mouth at bedtime as needed. 08/10/17   Georgiana Shore, PA-C  predniSONE (DELTASONE) 10 MG tablet Take 2 tablets (20 mg total) by mouth daily. Please take 4 tabs for 2 days, 3 tabs for 1 day, 2 tabs for 1 day, 1 tab for 1 day 10/16/15   Hedges, Tinnie Gens, PA-C  rifampin (RIFADIN) 300 MG capsule Take by mouth.    [provider]  vitamin B-12 (CYANOCOBALAMIN) 1000 MCG tablet Take 1,000 mcg by mouth daily.    [provider]    Family History No family history on file.  Social History Social History   Tobacco Use  . Smoking status: Never Smoker  Substance Use Topics  . Alcohol use: No  . Drug use: No     Allergies   Fish allergy   Review of Systems Review of Systems  Constitutional: Negative for activity change, chills, diaphoresis, fatigue, fever and unexpected weight change.  Respiratory: Negative for shortness of breath.   Cardiovascular: Negative for chest pain.  Gastrointestinal: Negative for abdominal pain, nausea, rectal pain and vomiting.  Genitourinary: Negative for difficulty urinating, dysuria and hematuria.  Musculoskeletal: Positive for back pain and myalgias. Negative for arthralgias, gait problem, joint swelling, neck pain and neck stiffness.  Skin: Negative for color change, pallor  and rash.  Neurological: Negative for weakness and numbness.     Physical Exam Updated Vital Signs BP (!) 155/87 (BP Location: Left Arm)   Pulse 80   Temp 97.9 F (36.6 C) (Oral)   Resp 20   Ht 5\' 5"  (1.651 m)   Wt 107 kg (236 lb)   SpO2 100%   BMI 39.27 kg/m   Physical Exam  Constitutional: She appears well-developed and well-nourished. No distress.  Afebrile, nontoxic appearing in no acute distress, sitting comfortably in bed  HENT:  Head: Atraumatic.  Eyes: EOM are normal.  Neck: Normal range of motion. Neck supple.  Cardiovascular: Normal rate, regular rhythm, normal heart  sounds and intact distal pulses.  Pulmonary/Chest: Effort normal. No stridor. No respiratory distress. She has no wheezes. She has no rales.  Abdominal: Soft. She exhibits no distension. There is no tenderness.  Musculoskeletal: Normal range of motion. She exhibits tenderness. She exhibits no edema or deformity.  No midline tenderness palpation of the spine.  Mild tenderness palpation of the left lower back musculature  Neurological: She is alert. No sensory deficit. She exhibits normal muscle tone.  Normal stance and gait, 5/5 strength to lower extremities bilaterally  Skin: Skin is warm and dry. No rash noted. She is not diaphoretic. No erythema. No pallor.  Psychiatric: She has a normal mood and affect. Her behavior is normal.  Nursing note and vitals reviewed.    ED Treatments / Results  Labs (all labs ordered are listed, but only abnormal results are displayed) Labs Reviewed - No data to display  EKG  EKG Interpretation None       Radiology No results found.  Procedures Procedures (including critical care time)  Medications Ordered in ED Medications - No data to display   Initial Impression / Assessment and Plan / ED Course  I have reviewed the triage vital signs and the nursing notes.  Pertinent labs & imaging results that were available during my care of the patient were reviewed by me and considered in my medical decision making (see chart for details).    Patient presents with lower back pain.  History and physical exam consistent with sciatica. No gross neurological deficits and normal neuro exam.  Patient has no gait abnormality or concern for cauda equina.  No loss of bowel or bladder control, fever, night sweats, weight loss, h/o malignancy, or IVDU.   She is otherwise well-appearing, nontoxic afebrile with normal stance and gait.  RICE protocol and pain medications indicated and discussed with patient.   Charge home with symptomatic relief and close  follow-up with PCP if symptoms persist beyond a week.  Discussed strict return precautions and advised to return to the emergency department if experiencing any new or worsening symptoms. Instructions were understood and patient agreed with discharge plan.   Final Clinical Impressions(s) / ED Diagnoses   Final diagnoses:  Acute left-sided low back pain with left-sided sciatica    ED Discharge Orders        Ordered    methocarbamol (ROBAXIN) 500 MG tablet  At bedtime PRN     08/10/17 1635    Menthol-Camphor (ICY HOT ADVANCED RELIEF) 16-11 % CREA  2 times daily PRN     08/10/17 1635       Gregary CromerMitchell, Lilyrose Tanney B, PA-C 08/10/17 1725    Gwyneth SproutPlunkett, Whitney, MD 08/10/17 [phone]

## 2017-08-10 NOTE — ED Triage Notes (Signed)
L lower back painx 3 days. Went to UC, given meloxicam. Pt reports pain is ongoing. Denies injury.

## 2018-09-17 ENCOUNTER — Emergency Department (HOSPITAL_BASED_OUTPATIENT_CLINIC_OR_DEPARTMENT_OTHER)
Admission: EM | Admit: 2018-09-17 | Discharge: 2018-09-17 | Disposition: A | Discharge status: Home / Self Care | Payer: BLUE CROSS/BLUE SHIELD | Attending: Emergency Medicine | Admitting: Emergency Medicine

## 2018-09-17 ENCOUNTER — Other Ambulatory Visit: Payer: Self-pay

## 2018-09-17 ENCOUNTER — Encounter (HOSPITAL_BASED_OUTPATIENT_CLINIC_OR_DEPARTMENT_OTHER): Payer: Self-pay | Admitting: Emergency Medicine

## 2018-09-17 ENCOUNTER — Emergency Department (HOSPITAL_BASED_OUTPATIENT_CLINIC_OR_DEPARTMENT_OTHER): Payer: BLUE CROSS/BLUE SHIELD

## 2018-09-17 DIAGNOSIS — N23 Unspecified renal colic: Secondary | ICD-10-CM | POA: Diagnosis not present

## 2018-09-17 DIAGNOSIS — J45909 Unspecified asthma, uncomplicated: Secondary | ICD-10-CM | POA: Diagnosis not present

## 2018-09-17 DIAGNOSIS — Z79899 Other long term (current) drug therapy: Secondary | ICD-10-CM | POA: Insufficient documentation

## 2018-09-17 DIAGNOSIS — I10 Essential (primary) hypertension: Secondary | ICD-10-CM | POA: Diagnosis not present

## 2018-09-17 DIAGNOSIS — R739 Hyperglycemia, unspecified: Secondary | ICD-10-CM | POA: Insufficient documentation

## 2018-09-17 DIAGNOSIS — R109 Unspecified abdominal pain: Secondary | ICD-10-CM | POA: Diagnosis present

## 2018-09-17 LAB — URINALYSIS, ROUTINE W REFLEX MICROSCOPIC
Bilirubin Urine: NEGATIVE
GLUCOSE, UA: NEGATIVE mg/dL
Ketones, ur: NEGATIVE mg/dL
Leukocytes,Ua: NEGATIVE
Nitrite: NEGATIVE
PROTEIN: 30 mg/dL — AB
Specific Gravity, Urine: >1.03 — ABNORMAL HIGH (ref 1.005–1.030)
pH: 5.5 (ref 5.0–8.0)

## 2018-09-17 LAB — COMPREHENSIVE METABOLIC PANEL
ALBUMIN: 3.7 g/dL (ref 3.5–5.0)
ALT: 61 U/L — ABNORMAL HIGH (ref 0–44)
ANION GAP: 11 (ref 5–15)
AST: 38 U/L (ref 15–41)
Alkaline Phosphatase: 68 U/L (ref 38–126)
BUN: 22 mg/dL — ABNORMAL HIGH (ref 6–20)
CHLORIDE: 106 mmol/L (ref 98–111)
CO2: 21 mmol/L — AB (ref 22–32)
Calcium: 8.8 mg/dL — ABNORMAL LOW (ref 8.9–10.3)
Creatinine, Ser: 0.96 mg/dL (ref 0.44–1.00)
GFR calc Af Amer: >60 mL/min (ref >60)
GFR calc non Af Amer: >60 mL/min (ref >60)
GLUCOSE: 213 mg/dL — AB (ref 70–99)
POTASSIUM: 3.1 mmol/L — AB (ref 3.5–5.1)
SODIUM: 138 mmol/L (ref 135–145)
TOTAL PROTEIN: 7.4 g/dL (ref 6.5–8.1)
Total Bilirubin: 0.7 mg/dL (ref 0.3–1.2)

## 2018-09-17 LAB — CBC WITH DIFFERENTIAL/PLATELET
Abs Immature Granulocytes: 0.03 10*3/uL (ref 0.00–0.07)
BASOS ABS: 0 10*3/uL (ref 0.0–0.1)
Basophils Relative: 0 %
EOS ABS: 0.1 10*3/uL (ref 0.0–0.5)
EOS PCT: 1 %
HCT: 40.4 % (ref 36.0–46.0)
Hemoglobin: 13.1 g/dL (ref 12.0–15.0)
Immature Granulocytes: 0 %
Lymphocytes Relative: 23 %
Lymphs Abs: 2.2 10*3/uL (ref 0.7–4.0)
MCH: 30.2 pg (ref 26.0–34.0)
MCHC: 32.4 g/dL (ref 30.0–36.0)
MCV: 93.1 fL (ref 80.0–100.0)
Monocytes Absolute: 0.7 10*3/uL (ref 0.1–1.0)
Monocytes Relative: 8 %
NRBC: 0 % (ref 0.0–0.2)
Neutro Abs: 6.4 10*3/uL (ref 1.7–7.7)
Neutrophils Relative %: 68 %
PLATELETS: 204 10*3/uL (ref 150–400)
RBC: 4.34 MIL/uL (ref 3.87–5.11)
RDW: 13.9 % (ref 11.5–15.5)
WBC: 9.4 10*3/uL (ref 4.0–10.5)

## 2018-09-17 LAB — URINALYSIS, MICROSCOPIC (REFLEX): RBC / HPF: >50 RBC/hpf (ref 0–5)

## 2018-09-17 MED ORDER — HYDROMORPHONE HCL 1 MG/ML IJ SOLN
1.0000 mg | Freq: Once | INTRAMUSCULAR | Status: AC
  Administered 2018-09-17: 1 mg via INTRAVENOUS
  Filled 2018-09-17: qty 1

## 2018-09-17 MED ORDER — OXYCODONE-ACETAMINOPHEN 5-325 MG PO TABS: 1.0000 | ORAL_TABLET | ORAL | 0 refills | Status: AC | PRN

## 2018-09-17 MED ORDER — KETOROLAC TROMETHAMINE 30 MG/ML IJ SOLN
30.0000 mg | Freq: Once | INTRAMUSCULAR | Status: AC
  Administered 2018-09-17: 30 mg via INTRAVENOUS
  Filled 2018-09-17: qty 1

## 2018-09-17 MED ORDER — ONDANSETRON HCL 4 MG PO TABS: 4.0000 mg | ORAL_TABLET | Freq: Four times a day (QID) | ORAL | 0 refills | Status: AC

## 2018-09-17 MED ORDER — ONDANSETRON HCL 4 MG/2ML IJ SOLN
4.0000 mg | Freq: Once | INTRAMUSCULAR | Status: AC
  Administered 2018-09-17: 4 mg via INTRAVENOUS
  Filled 2018-09-17: qty 2

## 2018-09-17 MED ORDER — TAMSULOSIN HCL 0.4 MG PO CAPS: 0.4000 mg | ORAL_CAPSULE | Freq: Every day | ORAL | 0 refills | Status: AC

## 2018-09-17 NOTE — ED Notes (Signed)
Attempted IV access x2 without success, second RN to attempt ?

## 2018-09-17 NOTE — ED Notes (Signed)
ED Provider at bedside. 

## 2018-09-17 NOTE — ED Notes (Signed)
Patient transported to CT. Placed on 1L [place] after hydromorphone administration decreased O2 sats to 72% with good pleth.

## 2018-09-17 NOTE — ED Provider Notes (Signed)
[facility] EMERGENCY DEPARTMENT Provider Note   CSN: [id number] Arrival date & time: 09/17/18  3825    History   Chief Complaint Chief Complaint  Patient presents with  . Flank Pain    HPI Linda Mckay is a 58 y.o. female.     Patient presents to the emergency department for evaluation of right flank pain.  Patient reports sudden onset of severe right-sided flank pain 1 hour ago.  Pain radiates to the right groin.  She has had nausea and vomiting.  She has not noticed any urinary symptoms.  She has never had similar pain.     Past Medical History:  Diagnosis Date  . Asthma    in childhood  . Hypertension     There are no active problems to display for this patient.   History reviewed. No pertinent surgical history.   OB History   No obstetric history on file.      Home Medications    Prior to Admission medications   Medication Sig Start Date End Date Taking? Authorizing Provider  gabapentin (NEURONTIN) 300 MG capsule Take 300 mg by mouth 3 (three) times daily.    [provider]  loratadine (CLARITIN) 10 MG tablet Take 1 tablet (10 mg total) by mouth daily. 10/16/15   Hedges, Tinnie Gens, PA-C  meloxicam (MOBIC) 15 MG tablet Take 15 mg by mouth daily.    [provider]  Menthol-Camphor (ICY HOT ADVANCED RELIEF) 16-11 % CREA Apply 1 application topically 2 (two) times daily as needed. 08/10/17   Georgiana Shore, PA-C  methocarbamol (ROBAXIN) 500 MG tablet Take 1 tablet (500 mg total) by mouth at bedtime as needed. 08/10/17   Georgiana Shore, PA-C  predniSONE (DELTASONE) 10 MG tablet Take 2 tablets (20 mg total) by mouth daily. Please take 4 tabs for 2 days, 3 tabs for 1 day, 2 tabs for 1 day, 1 tab for 1 day 10/16/15   Hedges, Tinnie Gens, PA-C  rifampin (RIFADIN) 300 MG capsule Take by mouth.    [provider]  vitamin B-12 (CYANOCOBALAMIN) 1000 MCG tablet Take 1,000 mcg by mouth daily.    [provider]    Family  History Family History  Problem Relation Age of Onset  . Stroke Mother   . Stroke Brother     Social History Social History   Tobacco Use  . Smoking status: Never Smoker  . Smokeless tobacco: Never Used  Substance Use Topics  . Alcohol use: No  . Drug use: No     Allergies   Fish allergy   Review of Systems Review of Systems  Gastrointestinal: Positive for nausea and vomiting.  Genitourinary: Positive for flank pain.  All other systems reviewed and are negative.    Physical Exam Updated Vital Signs BP (!) 160/81 (BP Location: Right Arm)   Pulse 80   Temp (!) 97.5 F (36.4 C) (Oral)   Resp (!) 26   Ht 5\' 6"  (1.676 m)   Wt 119.7 kg   SpO2 100%   BMI 42.61 kg/m   Physical Exam Vitals signs and nursing note reviewed.  Constitutional:      General: She is in acute distress.     Appearance: Normal appearance. She is well-developed.  HENT:     Head: Normocephalic and atraumatic.     Right Ear: Hearing normal.     Left Ear: Hearing normal.     Nose: Nose normal.  Eyes:     Conjunctiva/sclera: Conjunctivae normal.  Pupils: Pupils are equal, round, and reactive to light.  Neck:     Musculoskeletal: Normal range of motion and neck supple.  Cardiovascular:     Rate and Rhythm: Regular rhythm.     Heart sounds: S1 normal and S2 normal. No murmur. No friction rub. No gallop.   Pulmonary:     Effort: Pulmonary effort is normal. No respiratory distress.     Breath sounds: Normal breath sounds.  Chest:     Chest wall: No tenderness.  Abdominal:     General: Bowel sounds are normal.     Palpations: Abdomen is soft.     Tenderness: There is no abdominal tenderness. There is right CVA tenderness. There is no guarding or rebound. Negative signs include Murphy's sign and McBurney's sign.     Hernia: No hernia is present.  Musculoskeletal: Normal range of motion.  Skin:    General: Skin is warm and dry.     Findings: No rash.  Neurological:     Mental Status:  She is alert and oriented to person, place, and time.     GCS: GCS eye subscore is 4. GCS verbal subscore is 5. GCS motor subscore is 6.     Cranial Nerves: No cranial nerve deficit.     Sensory: No sensory deficit.     Coordination: Coordination normal.  Psychiatric:        Speech: Speech normal.        Behavior: Behavior normal.        Thought Content: Thought content normal.      ED Treatments / Results  Labs (all labs ordered are listed, but only abnormal results are displayed) Labs Reviewed  COMPREHENSIVE METABOLIC PANEL - Abnormal; Notable for the following components:      Result Value   Potassium 3.1 (*)    CO2 21 (*)    Glucose, Bld 213 (*)    BUN 22 (*)    Calcium 8.8 (*)    ALT 61 (*)    All other components within normal limits  CBC WITH DIFFERENTIAL/PLATELET  URINALYSIS, ROUTINE W REFLEX MICROSCOPIC    EKG None  Radiology No results found.  Procedures Procedures (including critical care time)  Medications Ordered in ED Medications  HYDROmorphone (DILAUDID) injection 1 mg (1 mg Intravenous Given 09/17/18 0616)  ondansetron (ZOFRAN) injection 4 mg (4 mg Intravenous Given 09/17/18 0616)     Initial Impression / Assessment and Plan / ED Course  I have reviewed the triage vital signs and the nursing notes.  Pertinent labs & imaging results that were available during my care of the patient were reviewed by me and considered in my medical decision making (see chart for details).        Patient presents to the emergency department for evaluation of sudden onset right flank pain.  Patient was in a great deal of distress at arrival.  This improved with analgesia.  CT renal stone study does confirm right UVJ stone.   No evidence of concomitant infection.  Patient's blood sugar is elevated here in the ER today.  This is likely a stress response, but will need close follow-up with PCP to ensure that she does not need treatment.  Final Clinical Impressions(s) /  ED Diagnoses   Final diagnoses:  Ureteral colic  Hyperglycemia    ED Discharge Orders    None       Gilda Crease, MD 09/17/18 [phone]

## 2018-09-17 NOTE — ED Triage Notes (Signed)
Pt is c/o right flank pain that started an hour ago  Pt describes the pain as sharp  Pt states she has had vomiting associated with the pain

## 2018-09-17 NOTE — ED Notes (Signed)
Prompted to provide urine sample. Continues to rate pain 10/10 despite stating that the pain is improved.

## 2019-06-21 ENCOUNTER — Emergency Department (HOSPITAL_BASED_OUTPATIENT_CLINIC_OR_DEPARTMENT_OTHER): Payer: BC Managed Care – PPO

## 2019-06-21 ENCOUNTER — Encounter (HOSPITAL_BASED_OUTPATIENT_CLINIC_OR_DEPARTMENT_OTHER): Payer: Self-pay | Admitting: *Deleted

## 2019-06-21 ENCOUNTER — Other Ambulatory Visit: Payer: Self-pay

## 2019-06-21 ENCOUNTER — Emergency Department (HOSPITAL_BASED_OUTPATIENT_CLINIC_OR_DEPARTMENT_OTHER)
Admission: EM | Admit: 2019-06-21 | Discharge: 2019-06-21 | Disposition: A | Discharge status: Home / Self Care | Payer: BC Managed Care – PPO | Attending: Emergency Medicine | Admitting: Emergency Medicine

## 2019-06-21 DIAGNOSIS — J45909 Unspecified asthma, uncomplicated: Secondary | ICD-10-CM | POA: Insufficient documentation

## 2019-06-21 DIAGNOSIS — R1011 Right upper quadrant pain: Secondary | ICD-10-CM | POA: Diagnosis present

## 2019-06-21 DIAGNOSIS — Z79899 Other long term (current) drug therapy: Secondary | ICD-10-CM | POA: Insufficient documentation

## 2019-06-21 DIAGNOSIS — I10 Essential (primary) hypertension: Secondary | ICD-10-CM | POA: Diagnosis not present

## 2019-06-21 DIAGNOSIS — R0781 Pleurodynia: Secondary | ICD-10-CM | POA: Diagnosis not present

## 2019-06-21 DIAGNOSIS — R52 Pain, unspecified: Secondary | ICD-10-CM

## 2019-06-21 DIAGNOSIS — U071 COVID-19: Secondary | ICD-10-CM | POA: Diagnosis not present

## 2019-06-21 LAB — URINALYSIS, ROUTINE W REFLEX MICROSCOPIC
Bilirubin Urine: NEGATIVE
Glucose, UA: NEGATIVE mg/dL
Ketones, ur: NEGATIVE mg/dL
Leukocytes,Ua: NEGATIVE
Nitrite: NEGATIVE
Protein, ur: NEGATIVE mg/dL
Specific Gravity, Urine: 1.025 (ref 1.005–1.030)
pH: 6 (ref 5.0–8.0)

## 2019-06-21 LAB — COMPREHENSIVE METABOLIC PANEL
ALT: 58 U/L — ABNORMAL HIGH (ref 0–44)
AST: 46 U/L — ABNORMAL HIGH (ref 15–41)
Albumin: 3.8 g/dL (ref 3.5–5.0)
Alkaline Phosphatase: 63 U/L (ref 38–126)
Anion gap: 7 (ref 5–15)
BUN: 12 mg/dL (ref 6–20)
CO2: 25 mmol/L (ref 22–32)
Calcium: 8.8 mg/dL — ABNORMAL LOW (ref 8.9–10.3)
Chloride: 105 mmol/L (ref 98–111)
Creatinine, Ser: 0.8 mg/dL (ref 0.44–1.00)
GFR calc Af Amer: >60 mL/min (ref >60)
GFR calc non Af Amer: >60 mL/min (ref >60)
Glucose, Bld: 161 mg/dL — ABNORMAL HIGH (ref 70–99)
Potassium: 3.9 mmol/L (ref 3.5–5.1)
Sodium: 137 mmol/L (ref 135–145)
Total Bilirubin: 1 mg/dL (ref 0.3–1.2)
Total Protein: 7.8 g/dL (ref 6.5–8.1)

## 2019-06-21 LAB — CBC WITH DIFFERENTIAL/PLATELET
Abs Immature Granulocytes: 0.01 10*3/uL (ref 0.00–0.07)
Basophils Absolute: 0 10*3/uL (ref 0.0–0.1)
Basophils Relative: 0 %
Eosinophils Absolute: 0 10*3/uL (ref 0.0–0.5)
Eosinophils Relative: 1 %
HCT: 40.6 % (ref 36.0–46.0)
Hemoglobin: 13.2 g/dL (ref 12.0–15.0)
Immature Granulocytes: 0 %
Lymphocytes Relative: 31 %
Lymphs Abs: 1 10*3/uL (ref 0.7–4.0)
MCH: 29.9 pg (ref 26.0–34.0)
MCHC: 32.5 g/dL (ref 30.0–36.0)
MCV: 91.9 fL (ref 80.0–100.0)
Monocytes Absolute: 0.3 10*3/uL (ref 0.1–1.0)
Monocytes Relative: 9 %
Neutro Abs: 2 10*3/uL (ref 1.7–7.7)
Neutrophils Relative %: 59 %
Platelets: 151 10*3/uL (ref 150–400)
RBC: 4.42 MIL/uL (ref 3.87–5.11)
RDW: 13.3 % (ref 11.5–15.5)
WBC: 3.3 10*3/uL — ABNORMAL LOW (ref 4.0–10.5)
nRBC: 0 % (ref 0.0–0.2)

## 2019-06-21 LAB — URINALYSIS, MICROSCOPIC (REFLEX)

## 2019-06-21 LAB — LIPASE, BLOOD: Lipase: 29 U/L (ref 11–51)

## 2019-06-21 MED ORDER — HYDROCODONE-ACETAMINOPHEN 5-325 MG PO TABS
2.0000 | ORAL_TABLET | Freq: Once | ORAL | Status: AC
  Administered 2019-06-21: 2 via ORAL
  Filled 2019-06-21: qty 2

## 2019-06-21 MED ORDER — HYDROCODONE-ACETAMINOPHEN 5-325 MG PO TABS: 1.0000 | ORAL_TABLET | Freq: Four times a day (QID) | ORAL | 0 refills | Status: DC | PRN

## 2019-06-21 MED ORDER — ONDANSETRON 4 MG PO TBDP
4.0000 mg | ORAL_TABLET | Freq: Once | ORAL | Status: AC
  Administered 2019-06-21: 4 mg via ORAL
  Filled 2019-06-21: qty 1

## 2019-06-21 MED ORDER — HYDROCODONE-ACETAMINOPHEN 5-325 MG PO TABS: 1.0000 | ORAL_TABLET | Freq: Four times a day (QID) | ORAL | 0 refills | Status: AC | PRN

## 2019-06-21 NOTE — Discharge Instructions (Signed)
Get help right away if: °You have nausea or vomiting. °You feel sweaty or light-headed. °You have a cough with mucus from your lungs (sputum) or you cough up blood. °You develop shortness of breath. °

## 2019-06-21 NOTE — ED Triage Notes (Signed)
Pr reports back pain and abdominal pain x 3 days. Denies n/v/d. Reports chills

## 2019-06-21 NOTE — ED Provider Notes (Signed)
[facility] EMERGENCY DEPARTMENT Provider Note   CSN: [id number] Arrival date & time: 06/21/19  1850     History Chief Complaint  Patient presents with  . Abdominal Pain    Back Pain    Linda Mckay is a 58 y.o. female who presents to the ed with a cc back and abdominal pain.  Patient states that she was at work 3 days ago wearing disposable facemask when she inhaled a piece of lint from the fabric.  She began coughing and states that she had coughing fits all night long had difficulty sleeping.  The next day she stayed in bed for most of the day but when she did get up she started having severe pain from her back all the way around to her abdomen.  She gestures to the distribution of her diaphragm.  She states that she cannot get very comfortable it hurts when she is lying down or sleeping in her recliner.  She does feel better when she is standing.  She denies any hemoptysis, shortness of breath.  She denies fevers or chills.  Her cough went away.  She denies nausea, right upper quadrant abdominal pain.  She has never had anything like this before.  She is not had any vomiting.  She denies hemoptysis or unilateral leg swelling.  HPI     Past Medical History:  Diagnosis Date  . Asthma    in childhood  . Hypertension     There are no problems to display for this patient.   History reviewed. No pertinent surgical history.   OB History   No obstetric history on file.     Family History  Problem Relation Age of Onset  . Stroke Mother   . Stroke Brother     Social History   Tobacco Use  . Smoking status: Never Smoker  . Smokeless tobacco: Never Used  Substance Use Topics  . Alcohol use: No  . Drug use: No    Home Medications Prior to Admission medications   Medication Sig Start Date End Date Taking? Authorizing Provider  gabapentin (NEURONTIN) 300 MG capsule Take 300 mg by mouth 3 (three) times daily.    [provider]  loratadine (CLARITIN)  10 MG tablet Take 1 tablet (10 mg total) by mouth daily. 10/16/15   Hedges, Tinnie Gens, PA-C  meloxicam (MOBIC) 15 MG tablet Take 15 mg by mouth daily.    [provider]  Menthol-Camphor (ICY HOT ADVANCED RELIEF) 16-11 % CREA Apply 1 application topically 2 (two) times daily as needed. 08/10/17   Georgiana Shore, PA-C  methocarbamol (ROBAXIN) 500 MG tablet Take 1 tablet (500 mg total) by mouth at bedtime as needed. 08/10/17   Mathews Robinsons B, PA-C  ondansetron (ZOFRAN) 4 MG tablet Take 1 tablet (4 mg total) by mouth every 6 (six) hours. 09/17/18   Gilda Crease, MD  oxyCODONE-acetaminophen (PERCOCET) 5-325 MG tablet Take 1-2 tablets by mouth every 4 (four) hours as needed. 09/17/18   Gilda Crease, MD  predniSONE (DELTASONE) 10 MG tablet Take 2 tablets (20 mg total) by mouth daily. Please take 4 tabs for 2 days, 3 tabs for 1 day, 2 tabs for 1 day, 1 tab for 1 day 10/16/15   Hedges, Tinnie Gens, PA-C  rifampin (RIFADIN) 300 MG capsule Take by mouth.    [provider]  tamsulosin (FLOMAX) 0.4 MG CAPS capsule Take 1 capsule (0.4 mg total) by mouth daily. 09/17/18   Gilda Crease, MD  vitamin B-12 (CYANOCOBALAMIN) 1000 MCG tablet Take 1,000 mcg by mouth daily.    [provider]    Allergies    Fish allergy and Shrimp [shellfish allergy]  Review of Systems   Review of Systems Ten systems reviewed and are negative for acute change, except as noted in the HPI.   Physical Exam Updated Vital Signs BP 128/75 (BP Location: Right Wrist)   Pulse (!) 112   Temp 99.5 F (37.5 C) (Oral)   Resp 20   Ht 5\' 5"  (1.651 m)   Wt 120.7 kg   SpO2 100%   BMI 44.26 kg/m   Physical Exam Vitals and nursing note reviewed.  Constitutional:      General: She is not in acute distress.    Appearance: She is well-developed. She is not diaphoretic.  HENT:     Head: Normocephalic and atraumatic.  Eyes:     General: No scleral icterus.    Conjunctiva/sclera:  Conjunctivae normal.  Cardiovascular:     Rate and Rhythm: Normal rate and regular rhythm.     Heart sounds: Normal heart sounds. No murmur. No friction rub. No gallop.   Pulmonary:     Effort: Pulmonary effort is normal. No respiratory distress.     Breath sounds: Normal breath sounds.  Abdominal:     General: Bowel sounds are normal. There is no distension.     Palpations: Abdomen is soft. There is no mass.     Tenderness: There is abdominal tenderness in the right upper quadrant, epigastric area and left upper quadrant. There is no guarding.  Musculoskeletal:     Cervical back: Normal range of motion.     Thoracic back: Tenderness present. Decreased range of motion.       Back:  Skin:    General: Skin is warm and dry.  Neurological:     Mental Status: She is alert and oriented to person, place, and time.  Psychiatric:        Behavior: Behavior normal.     ED Results / Procedures / Treatments   Labs (all labs ordered are listed, but only abnormal results are displayed) Labs Reviewed  CBC WITH DIFFERENTIAL/PLATELET - Abnormal; Notable for the following components:      Result Value   WBC 3.3 (*)    All other components within normal limits  URINALYSIS, ROUTINE W REFLEX MICROSCOPIC  COMPREHENSIVE METABOLIC PANEL    EKG None  Radiology No results found.  Procedures Procedures (including critical care time)  Medications Ordered in ED Medications - No data to display  ED Course  I have reviewed the triage vital signs and the nursing notes.  Pertinent labs & imaging results that were available during my care of the patient were reviewed by me and considered in my medical decision making (see chart for details).    MDM Rules/Calculators/A&P                      58 year old female here with complaint of pain after coughing. I have reviewed the patient's labs which shows UA with small amount of hemoglobin, no other abnormality.  No signs of infection.  Lipase  within normal limits.  CMP shows slightly elevated blood glucose, AST and ALT are mildly elevated without significant elevation.  White blood cell count is mildly decreased.  She is afebrile and hemodynamically stable.  I personally reviewed the patient's 2 view chest x-ray and thoracic spine x-ray which shows no acute abnormalities on my  interpretation.  Frontal diagnosis includes biliary colic, diaphragmatic injury, muscle spasm, muscle strain, bilateral lower lobe pneumonias, thoracic vertebral injury. Patient was reevaluated by Dr. Jacqulyn BathLong (please see his note) on per reevaluation patient had focal tenderness over the ribs and was feeling greatly improved after pain medications.  That this is likely musculoskeletal after heavy coughing from a minor aspiration event.  Patient did complain of chills to Dr. Jacqulyn BathLong and we will add on a Covid test which she can follow-up with in the outpatient setting.  Patient appears otherwise appropriate for discharge at this time. Final Clinical Impression(s) / ED Diagnoses Final diagnoses:  None    Rx / DC Orders ED Discharge Orders    None       Arthor CaptainHarris, Kuba Shepherd, PA-C 06/21/19 2147    Maia PlanLong, Joshua G, MD 06/22/19 1310

## 2019-06-21 NOTE — ED Notes (Signed)
Pt unable to void at this time. 

## 2019-06-23 ENCOUNTER — Telehealth: Payer: Self-pay

## 2019-06-23 LAB — NOVEL CORONAVIRUS, NAA (HOSP ORDER, SEND-OUT TO REF LAB; TAT 18-24 HRS): SARS-CoV-2, NAA: DETECTED — AB

## 2019-06-23 NOTE — Telephone Encounter (Signed)
Pt's daughter called in, with pt, and is requesting results be sent to pt's employer. Fax number is [phone], attention: [employer]. Please advise.

## 2022-12-21 ENCOUNTER — Other Ambulatory Visit (HOSPITAL_BASED_OUTPATIENT_CLINIC_OR_DEPARTMENT_OTHER): Payer: Self-pay

## 2022-12-21 MED ORDER — MOUNJARO 5 MG/0.5ML ~~LOC~~ SOAJ: 5.0000 mg | SUBCUTANEOUS | 0 refills | Status: AC

## 2022-12-21 MED ORDER — MOUNJARO 2.5 MG/0.5ML ~~LOC~~ SOAJ
2.5000 mg | SUBCUTANEOUS | 0 refills | Status: AC
  Filled 2022-12-21: qty 2, 28d supply, fill #0

## 2022-12-24 ENCOUNTER — Other Ambulatory Visit (HOSPITAL_BASED_OUTPATIENT_CLINIC_OR_DEPARTMENT_OTHER): Payer: Self-pay

## 2023-01-01 ENCOUNTER — Other Ambulatory Visit (HOSPITAL_BASED_OUTPATIENT_CLINIC_OR_DEPARTMENT_OTHER): Payer: Self-pay

## 2023-01-16 ENCOUNTER — Other Ambulatory Visit (HOSPITAL_BASED_OUTPATIENT_CLINIC_OR_DEPARTMENT_OTHER): Payer: Self-pay
